# Patient Record
Sex: Female | Born: 2000 | Race: White | Hispanic: No | Marital: Single | Smoking: Never smoker
Health system: Southern US, Community
[De-identification: ages and names within clinical notes are randomized; demographics above are authoritative.]

## PROBLEM LIST (undated history)

## (undated) DIAGNOSIS — R51 Headache: Secondary | ICD-10-CM

## (undated) DIAGNOSIS — R519 Headache, unspecified: Secondary | ICD-10-CM

## (undated) HISTORY — DX: Headache: R51

## (undated) HISTORY — DX: Headache, unspecified: R51.9

## (undated) HISTORY — PX: NO PAST SURGERIES: SHX2092

---
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
## 2021-03-29 IMAGING — MR MR HEAD WO/W CM
15 of 19 series · 33 of 48 positions shown · IV contrast (multihance)
Comparison: Report from head CT 05/01/2001 (images unavailable)

CLINICAL DATA: Idiopathic hyperprolactinemia. Additional history
provided by technologist: Elevated prolactin levels, amenorrhea
since April 2019

EXAM:
MRI HEAD WITHOUT AND WITH CONTRAST
TECHNIQUE: Multiplanar, multiecho pulse sequences of the brain and surrounding
structures were obtained without and with intravenous contrast.
CONTRAST:  6mL MULTIHANCE GADOBENATE DIMEGLUMINE 529 MG/ML IV SOLN

[Series 2: T1 · sagittal · 5.0mm · 0.45mm/px · 3 of 23 slices shown]
[im 1/23]
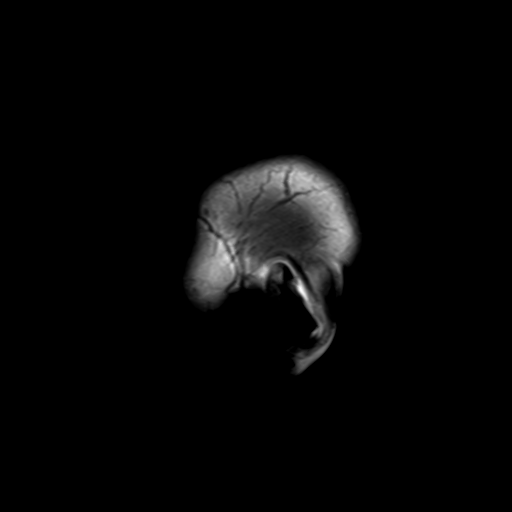
[im 12/23]
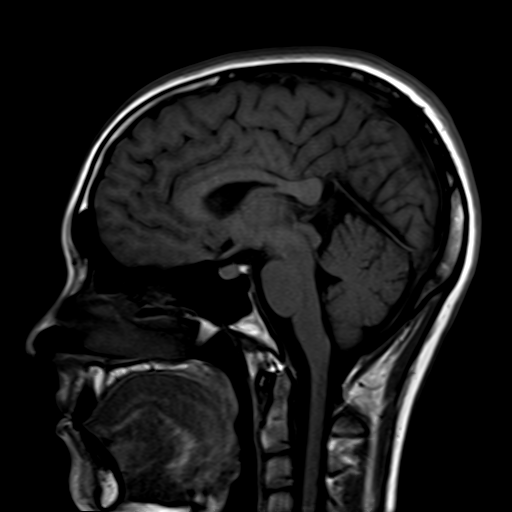
[im 23/23]
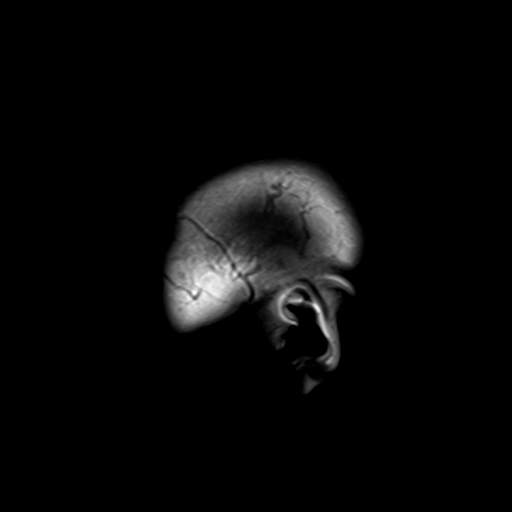

[Series 3: DWI · axial · 3.0mm · 1.80mm/px · z∈[-76,+68]mm · 8 of 100 slices shown]
[im 1/100]
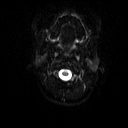
[im 12/100]
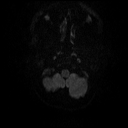
[im 34/100]
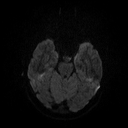
[im 45/100]
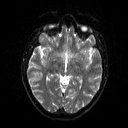
[im 56/100]
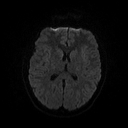
[im 67/100]
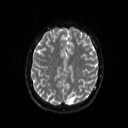
[im 89/100]
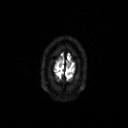
[im 100/100]
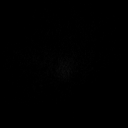

[Series 4: dwi_adc · axial · 3.0mm · 1.80mm/px · z∈[-76,+68]mm · 5 of 48 slices shown]
[im 1/48]
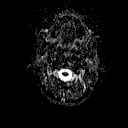
[im 12/48]
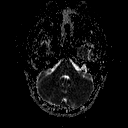
[im 24/48]
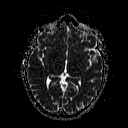
[im 36/48]
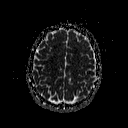
[im 48/48]
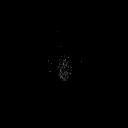

[Series 5: T2 · axial · 5.0mm · 0.36mm/px · z∈[-76,+71]mm · 2 of 24 slices shown]
[im 1/24]
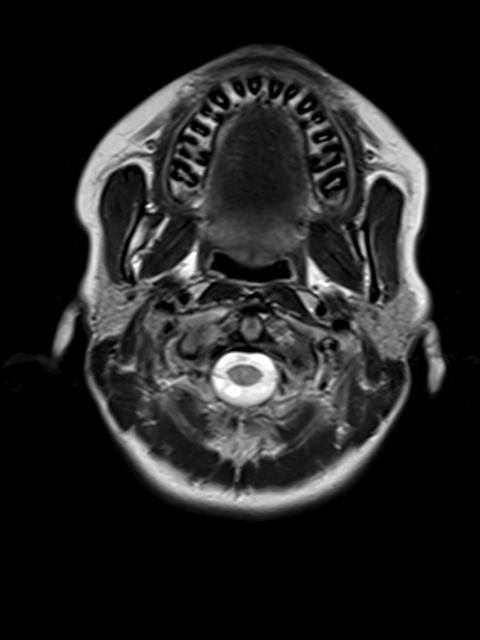
[im 24/24]
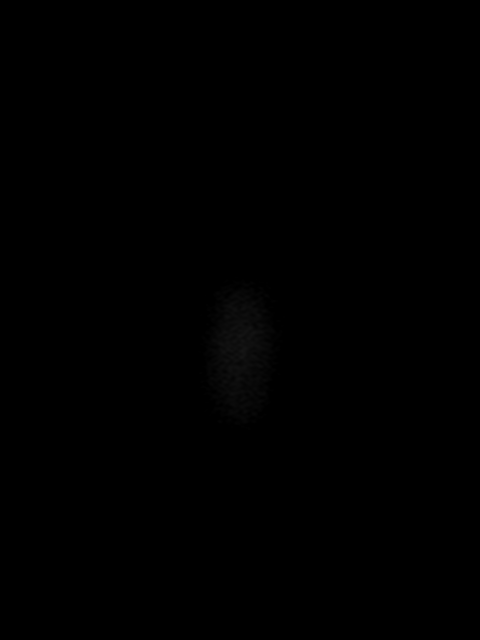

[Series 6: FLAIR · axial · 3.0mm · 0.45mm/px · z∈[-77,+69]mm · 3 of 33 slices shown]
[im 1/33]
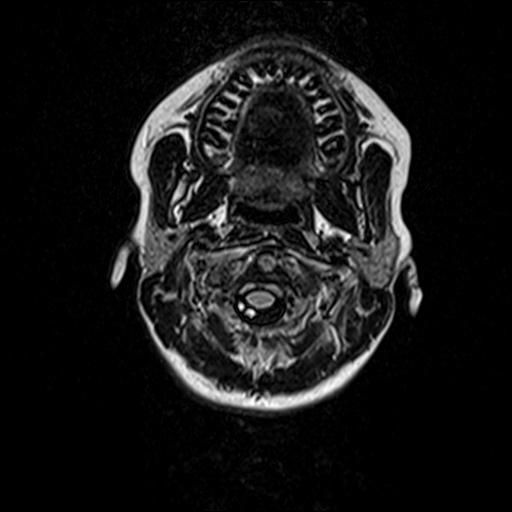
[im 17/33]
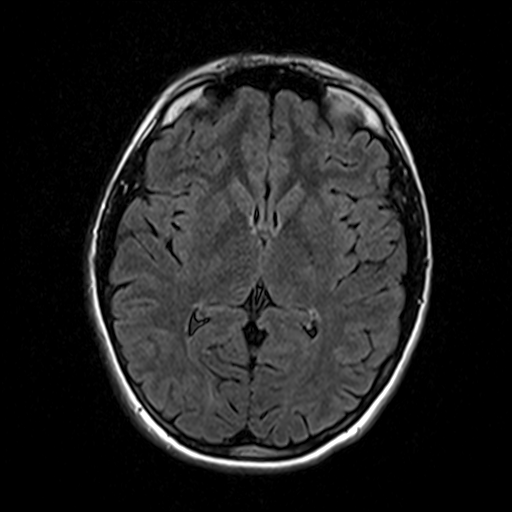
[im 33/33]
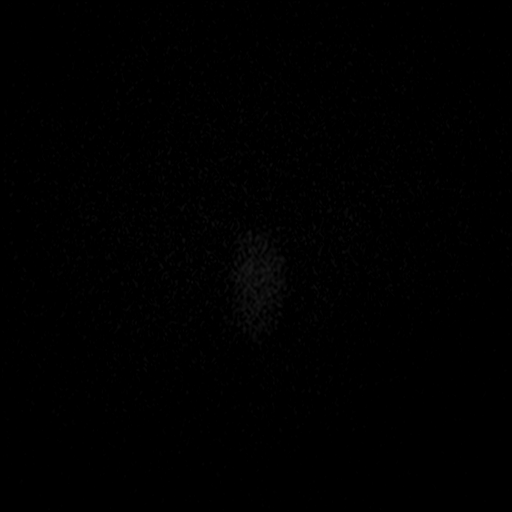

[Series 7: sag 3mm · sagittal · 3.0mm · 0.33mm/px · 1 of 14 slices shown]
[im 1/14]
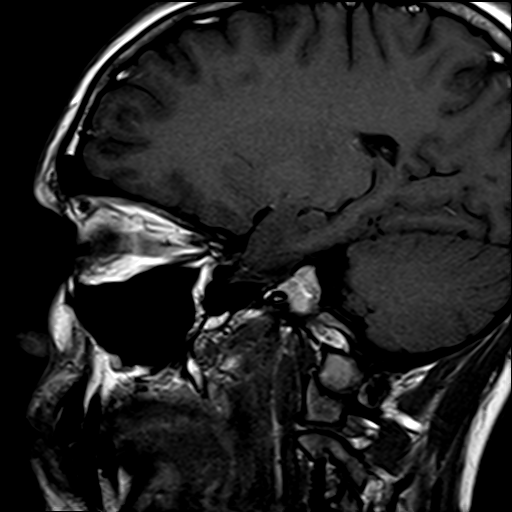

[Series 9: swi_images · axial · 5.0mm · 0.94mm/px · z∈[-81,+71]mm · 3 of 32 slices shown]
[im 1/32]
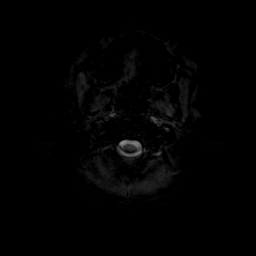
[im 16/32]
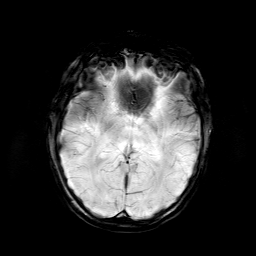
[im 32/32]
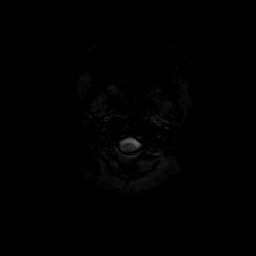

[Series 10: cor 3mm · coronal · 3.0mm · 0.33mm/px · 1 of 14 slices shown]
[im 1/14]
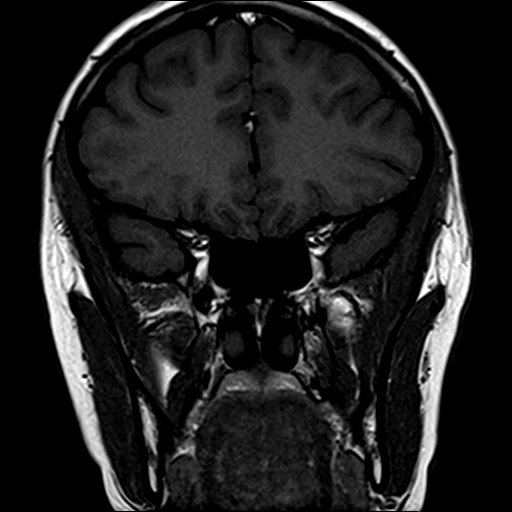

[Series 11: pre cor dynamic · coronal · non-contrast · 3.0mm · 0.35mm/px · 1 of 11 slices shown]
[im 1/11]
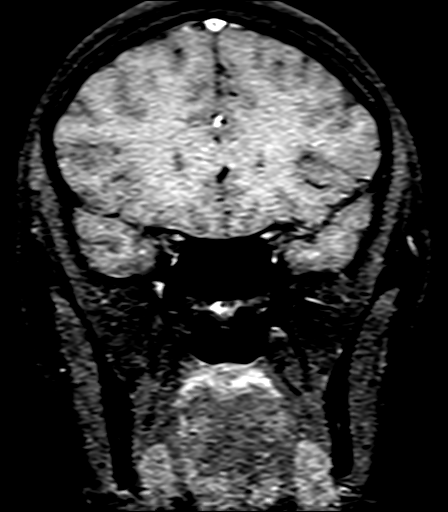

[Series 12: post fs cor · coronal · 3.0mm · 0.35mm/px · 1 of 11 slices shown (1 of 6)]
[im 1/11]
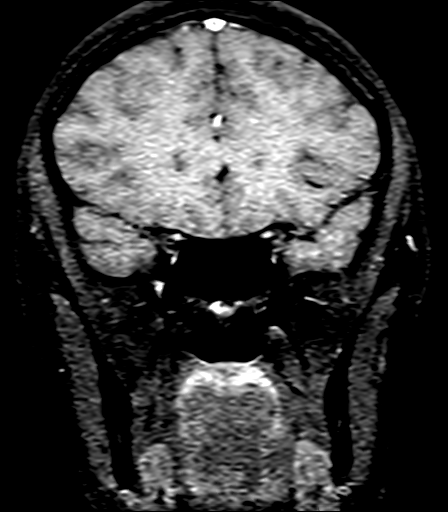

[Series 13: post fs cor · coronal · 3.0mm · 0.35mm/px · 1 of 11 slices shown (2 of 6)]
[im 1/11]
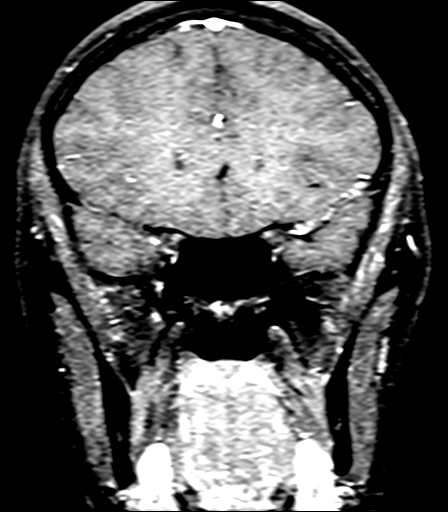

[Series 14: post fs cor · coronal · 3.0mm · 0.35mm/px · 1 of 11 slices shown (3 of 6)]
[im 1/11]
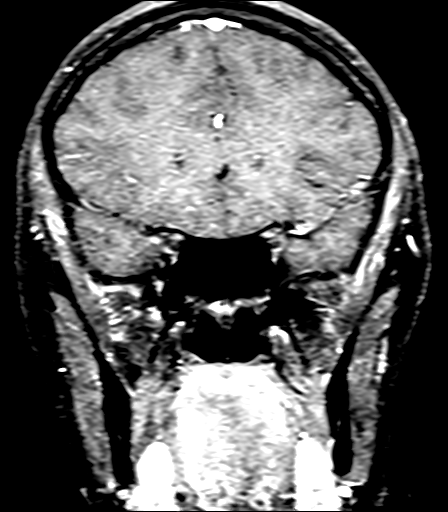

[Series 15: post fs cor · coronal · 3.0mm · 0.35mm/px · 1 of 11 slices shown (4 of 6)]
[im 1/11]
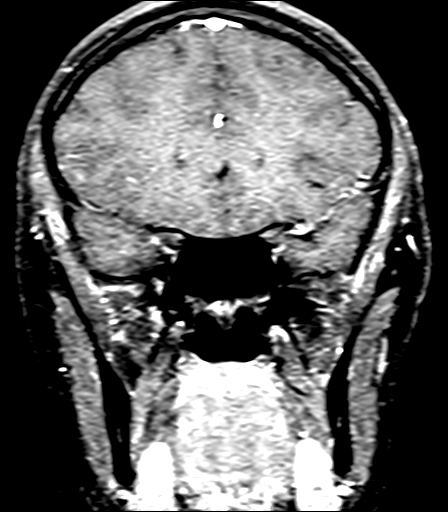

[Series 16: post fs cor · coronal · 3.0mm · 0.35mm/px · 1 of 11 slices shown (5 of 6)]
[im 1/11]
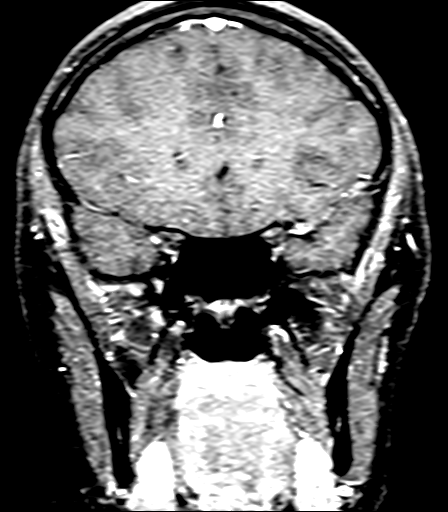

[Series 17: post fs cor · coronal · 3.0mm · 0.35mm/px · 1 of 11 slices shown (6 of 6)]
[im 1/11]
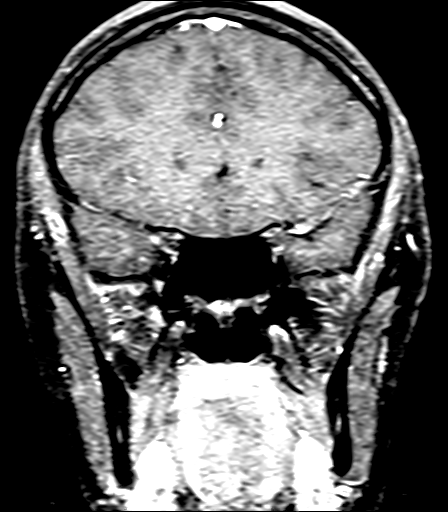

[33 of 48 positions shown; findings below may reference images not displayed]

FINDINGS: Brain:

The pituitary gland is within normal limits for size. No focal
signal abnormality or hypoenhancing focus is demonstrated within the
pituitary gland to suggest focal pituitary lesion. The pituitary
stalk is midline and is not abnormally thickened. The suprasellar
cistern is patent. No mass effect upon the optic chiasm.

There is no evidence of acute infarct.

No evidence of intracranial mass.

No midline shift or extra-axial fluid collection.

No chronic intracranial blood products.

No focal parenchymal signal abnormality is identified. No abnormal
intracranial enhancement.

Cerebral volume is normal for age.

Vascular: Flow voids maintained within the proximal large arterial
vessels. Expected enhancement within the proximal large arterial
vessels and dural venous sinuses.

Skull and upper cervical spine: Normal marrow signal.

Sinuses/Orbits: Visualized orbits demonstrate no acute abnormality.
Trace ethmoid sinus mucosal thickening. No significant mastoid
effusion.
IMPRESSION: No evidence of focal pituitary lesion.

Normal MRI appearance of the brain. No evidence of acute
intracranial abnormality.
